# Patient Record
Sex: Female | Born: 1990 | Race: White | Hispanic: No | Smoking: Never smoker
Health system: Southern US, Community
[De-identification: ages and names within clinical notes are randomized; demographics above are authoritative.]

## PROBLEM LIST (undated history)

## (undated) HISTORY — PX: SHOULDER ARTHROSCOPY: SHX128

---

## 2018-04-15 ENCOUNTER — Other Ambulatory Visit: Payer: Self-pay

## 2018-04-15 ENCOUNTER — Emergency Department
Admission: EM | Admit: 2018-04-15 | Discharge: 2018-04-16 | Disposition: A | Discharge status: Home / Self Care | Payer: BLUE CROSS/BLUE SHIELD | Attending: Emergency Medicine | Admitting: Emergency Medicine

## 2018-04-15 ENCOUNTER — Encounter: Payer: Self-pay | Admitting: Emergency Medicine

## 2018-04-15 ENCOUNTER — Emergency Department: Payer: BLUE CROSS/BLUE SHIELD

## 2018-04-15 DIAGNOSIS — R1013 Epigastric pain: Secondary | ICD-10-CM | POA: Diagnosis not present

## 2018-04-15 DIAGNOSIS — R112 Nausea with vomiting, unspecified: Secondary | ICD-10-CM | POA: Diagnosis present

## 2018-04-15 DIAGNOSIS — K2900 Acute gastritis without bleeding: Secondary | ICD-10-CM | POA: Diagnosis not present

## 2018-04-15 DIAGNOSIS — F1210 Cannabis abuse, uncomplicated: Secondary | ICD-10-CM | POA: Diagnosis not present

## 2018-04-15 LAB — URINALYSIS, COMPLETE (UACMP) WITH MICROSCOPIC
BACTERIA UA: NONE SEEN
Bilirubin Urine: NEGATIVE
Glucose, UA: NEGATIVE mg/dL
KETONES UR: 5 mg/dL — AB
Leukocytes, UA: NEGATIVE
Nitrite: NEGATIVE
PROTEIN: NEGATIVE mg/dL
Specific Gravity, Urine: 1.019 (ref 1.005–1.030)
pH: 5 (ref 5.0–8.0)

## 2018-04-15 LAB — COMPREHENSIVE METABOLIC PANEL
ALBUMIN: 4.4 g/dL (ref 3.5–5.0)
ALT: 16 U/L (ref 0–44)
ANION GAP: 8 (ref 5–15)
AST: 21 U/L (ref 15–41)
Alkaline Phosphatase: 51 U/L (ref 38–126)
BUN: 15 mg/dL (ref 6–20)
CHLORIDE: 104 mmol/L (ref 98–111)
CO2: 28 mmol/L (ref 22–32)
Calcium: 9 mg/dL (ref 8.9–10.3)
Creatinine, Ser: 0.74 mg/dL (ref 0.44–1.00)
GFR calc non Af Amer: >60 mL/min (ref >60)
GLUCOSE: 101 mg/dL — AB (ref 70–99)
POTASSIUM: 4 mmol/L (ref 3.5–5.1)
Sodium: 140 mmol/L (ref 135–145)
Total Bilirubin: 0.5 mg/dL (ref 0.3–1.2)
Total Protein: 7.7 g/dL (ref 6.5–8.1)

## 2018-04-15 LAB — CBC
HEMATOCRIT: 38.1 % (ref 35.0–47.0)
HEMOGLOBIN: 13.2 g/dL (ref 12.0–16.0)
MCH: 30.7 pg (ref 26.0–34.0)
MCHC: 34.6 g/dL (ref 32.0–36.0)
MCV: 88.8 fL (ref 80.0–100.0)
Platelets: 233 10*3/uL (ref 150–440)
RBC: 4.29 MIL/uL (ref 3.80–5.20)
RDW: 12.8 % (ref 11.5–14.5)
WBC: 9.2 10*3/uL (ref 3.6–11.0)

## 2018-04-15 LAB — POCT PREGNANCY, URINE: PREG TEST UR: NEGATIVE

## 2018-04-15 LAB — LIPASE, BLOOD: LIPASE: 30 U/L (ref 11–51)

## 2018-04-15 NOTE — ED Triage Notes (Signed)
Pt arrives via POV with complaints of nausea since Tuesday; emesis intermittently since Wednesday; decreased appetite; and a dull pain above her navel that started Wednesday.

## 2018-04-15 NOTE — ED Notes (Addendum)
Pt states that she has been experiencing pain in the middle of abdominal area and nausea since Tuesday. It comes goes and when it happens the only thing she can do is lay down because it makes her feel SOB and weak. Pt has vomited once today. Pt is alert and oriented x 4. Family at bedside.

## 2018-04-15 NOTE — ED Provider Notes (Signed)
[facility] Emergency Department Provider Note   ____________________________________________   First MD Initiated Contact with Patient 04/15/18 2334     (approximate)  I have reviewed the triage vital signs and the nursing notes.   HISTORY  Chief Complaint Emesis and Abdominal Pain    HPI Molly Sweeney is a 27 y.o. female who comes into the hospital today with some nausea vomiting and abdominal pain.  The patient states that the symptoms started on Tuesday which was approximately 5 days ago.  The patient reports that she only vomited for 3 days but she has been feeling nauseous and having intermittent painful epigastric pain.  She is never had these symptoms before.  She has been taking ibuprofen but it has not been helping.  The patient denies any fevers or sick contacts.  Currently her pain is a 2 out of 10 in intensity.  She states that it comes in waves.  She feels okay when she is laying down but states that when she is moving around the pain gets worse.  The patient decided to come into the hospital today for evaluation.   History reviewed. No pertinent past medical history.  There are no active problems to display for this patient.   Past Surgical History:  Procedure Laterality Date  . SHOULDER ARTHROSCOPY      Prior to Admission medications   Medication Sig Start Date End Date Taking? Authorizing Provider  ondansetron (ZOFRAN ODT) 4 MG disintegrating tablet Take 1 tablet (4 mg total) by mouth every 8 (eight) hours as needed for nausea or vomiting. 04/16/18   Rebecka ApleyWebster, Wray Goehring P, MD  sucralfate (CARAFATE) 1 g tablet Take 1 tablet (1 g total) by mouth 2 (two) times daily. 04/16/18   Rebecka ApleyWebster, Liliana Brentlinger P, MD    Allergies Patient has no known allergies.  No family history on file.  Social History Social History   Tobacco Use  . Smoking status: Never Smoker  . Smokeless tobacco: Never Used  Substance Use Topics  . Alcohol use: Yes   Alcohol/week: 0.6 oz    Types: 1 Standard drinks or equivalent per week  . Drug use: Yes    Frequency: 1.0 times per week    Types: Marijuana    Review of Systems  Constitutional: No fever/chills Eyes: No visual changes. ENT: No sore throat. Cardiovascular: Denies chest pain. Respiratory: Denies shortness of breath. Gastrointestinal:  abdominal pain, nausea, vomiting.  No diarrhea.  No constipation. Genitourinary: Negative for dysuria. Musculoskeletal: Negative for back pain. Skin: Negative for rash. Neurological: Negative for headaches, focal weakness or numbness.   ____________________________________________   PHYSICAL EXAM:  VITAL SIGNS: ED Triage Vitals  Enc Vitals Group     BP 04/15/18 1725 118/62     Pulse Rate 04/15/18 1725 (!) 58     Resp 04/15/18 1725 18     Temp 04/15/18 1725 97.7 F (36.5 C)     Temp Source 04/15/18 1725 Oral     SpO2 04/15/18 1725 99 %     Weight 04/15/18 1727 190 lb (86.2 kg)     Height 04/15/18 1727 5\' 8"  (1.727 m)     Head Circumference --      Peak Flow --      Pain Score 04/15/18 1739 5     Pain Loc --      Pain Edu? --      Excl. in GC? --     Constitutional: Alert and oriented. Well appearing and in mild  distress. Eyes: Conjunctivae are normal. PERRL. EOMI. Head: Atraumatic. Nose: No congestion/rhinnorhea. Mouth/Throat: Mucous membranes are moist.  Oropharynx non-erythematous. Cardiovascular: Normal rate, regular rhythm. Grossly normal heart sounds.  Good peripheral circulation. Respiratory: Normal respiratory effort.  No retractions. Lungs CTAB. Gastrointestinal: Soft and nontender. No distention.  Positive bowel sounds Musculoskeletal: No lower extremity tenderness nor edema.   Neurologic:  Normal speech and language.  Skin:  Skin is warm, dry and intact.  Psychiatric: Mood and affect are normal.   ____________________________________________   LABS (all labs ordered are listed, but only abnormal results are  displayed)  Labs Reviewed  COMPREHENSIVE METABOLIC PANEL - Abnormal; Notable for the following components:      Result Value   Glucose, Bld 101 (*)    All other components within normal limits  URINALYSIS, COMPLETE (UACMP) WITH MICROSCOPIC - Abnormal; Notable for the following components:   Color, Urine YELLOW (*)    APPearance CLEAR (*)    Hgb urine dipstick SMALL (*)    Ketones, ur 5 (*)    All other components within normal limits  LIPASE, BLOOD  CBC  POC URINE PREG, ED  POCT PREGNANCY, URINE   ____________________________________________  EKG  ED ECG REPORT I, Rebecka Apley, the attending physician, personally viewed and interpreted this ECG.   Date: 04/14/2018  EKG Time: 1749  Rate: 60  Rhythm: normal sinus rhythm with sinus arrythmia  Axis: normal  Intervals:none  ST&T Change: none  ____________________________________________  RADIOLOGY  ED MD interpretation:  [place] abd RUQ: Unremarkable right upper quadrant ultrasound  Official radiology report(s): US Abdomen Limited Ruq  Result Date: 04/16/2018 CLINICAL DATA:  27 year old female with epigastric pain. EXAM: ULTRASOUND ABDOMEN LIMITED RIGHT UPPER QUADRANT COMPARISON:  None. FINDINGS: Gallbladder: No gallstones or wall thickening visualized. No sonographic Murphy sign noted by sonographer. Common bile duct: Diameter: 3 mm Liver: No focal lesion identified. Within normal limits in parenchymal echogenicity. Portal vein is patent on color Doppler imaging with normal direction of blood flow towards the liver. IMPRESSION: Unremarkable right upper quadrant ultrasound. Electronically Signed   By: Elgie Collard M.D.   On: 04/16/2018 00:24    ____________________________________________   PROCEDURES  Procedure(s) performed: None  Procedures  Critical Care performed: No  ____________________________________________   INITIAL IMPRESSION / ASSESSMENT AND PLAN / ED COURSE  As part of my medical decision  making, I reviewed the following data within the electronic MEDICAL RECORD NUMBER Notes from prior ED visits and [place] Controlled Substance Database   This is a 27 year old female who comes into the hospital today with some nausea vomiting and upper abdominal pain.  The patient has been having the symptoms for the past 5 days but she was concerned that she was still having some pain so she wanted to come to get evaluated.  We did check some blood work to include a CBC, CMP, lipase and a urinalysis.  The patient's blood work is unremarkable.  She does not have any significant pain on exam but given this intermittent pain I will send her for an ultrasound looking for possible gallstones.  The patient will be reassessed.  The patient has no pain at this time.  She will be discharged home and encouraged to follow-up with the acute care clinic.  She should return with any worsening symptoms or any other concerns.        ____________________________________________   FINAL CLINICAL IMPRESSION(S) / ED DIAGNOSES  Final diagnoses:  Epigastric pain  Acute gastritis without hemorrhage, unspecified  gastritis type     ED Discharge Orders        Ordered    ondansetron (ZOFRAN ODT) 4 MG disintegrating tablet  Every 8 hours PRN     04/16/18 0139    sucralfate (CARAFATE) 1 g tablet  2 times daily     04/16/18 0139       Note:  This document was prepared using Dragon voice recognition software and may include unintentional dictation errors.    Rebecka Apley, MD 04/16/18 [phone]

## 2018-04-16 MED ORDER — ONDANSETRON 4 MG PO TBDP: 4.0000 mg | ORAL_TABLET | Freq: Three times a day (TID) | ORAL | 0 refills | Status: AC | PRN

## 2018-04-16 MED ORDER — SUCRALFATE 1 G PO TABS: 1.0000 g | ORAL_TABLET | Freq: Two times a day (BID) | ORAL | 0 refills | Status: AC

## 2018-04-16 NOTE — Discharge Instructions (Addendum)
Please follow up with your primary care physician for further evaluation of your symptoms. Please return with an worsening condition.

## 2018-04-16 NOTE — ED Notes (Addendum)
Gave pt some water for a PO challenge. Will check on pt soon to see how she tolerated it.

## 2019-06-24 ENCOUNTER — Other Ambulatory Visit: Payer: Self-pay

## 2019-06-24 DIAGNOSIS — Z20822 Contact with and (suspected) exposure to covid-19: Secondary | ICD-10-CM

## 2019-06-26 LAB — NOVEL CORONAVIRUS, NAA: SARS-CoV-2, NAA: DETECTED — AB

## 2019-12-20 IMAGING — US US ABDOMEN LIMITED
1 series · 14 of 25 positions shown · non-contrast
Comparison: None.

CLINICAL DATA: 27-year-old female with epigastric pain.

EXAM:
ULTRASOUND ABDOMEN LIMITED RIGHT UPPER QUADRANT

[Series 1: us abdomen limited · 14 of 52 slices shown]
[im 1/52]
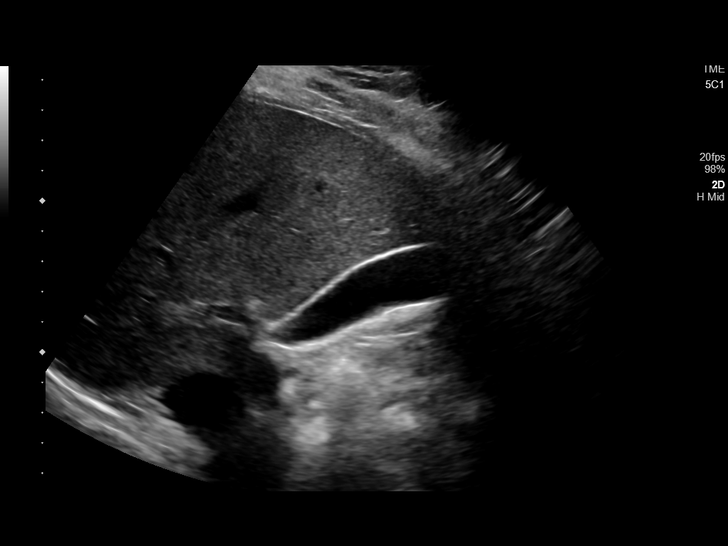
[im 5/52]
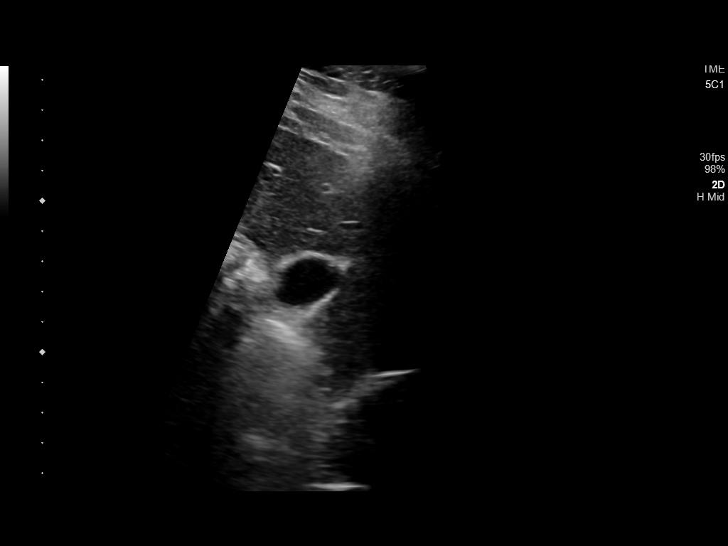
[im 9/52]
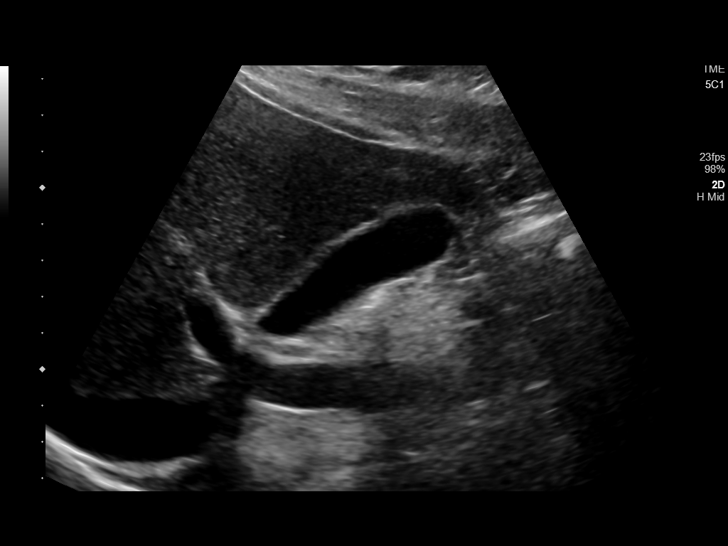
[im 13/52]
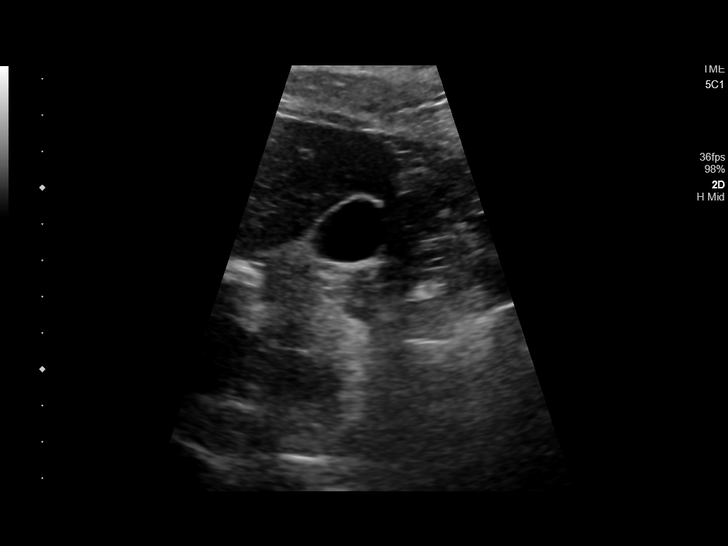
[im 18/52]
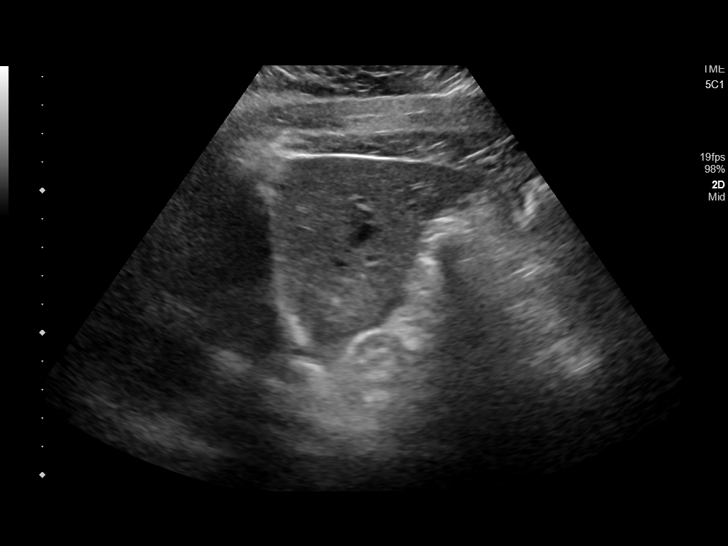
[im 20/52]
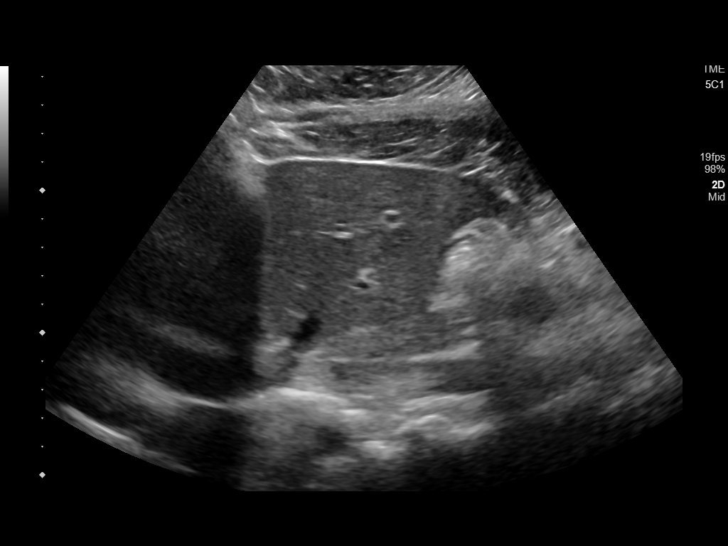
[im 24/52]
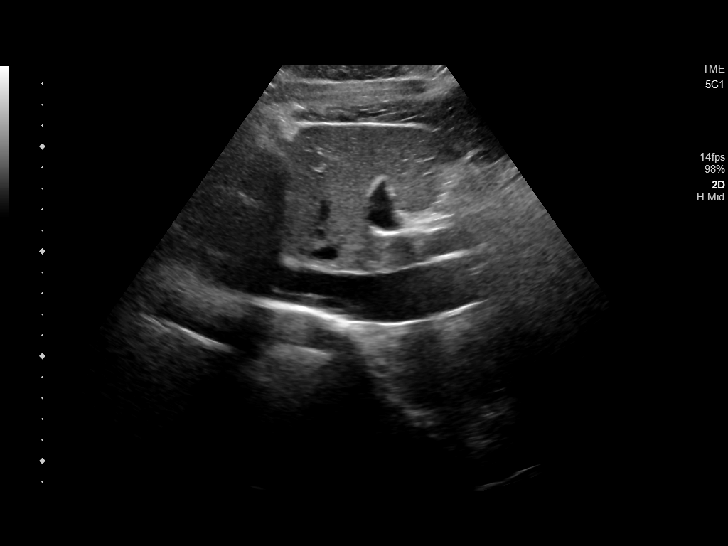
[im 28/52]
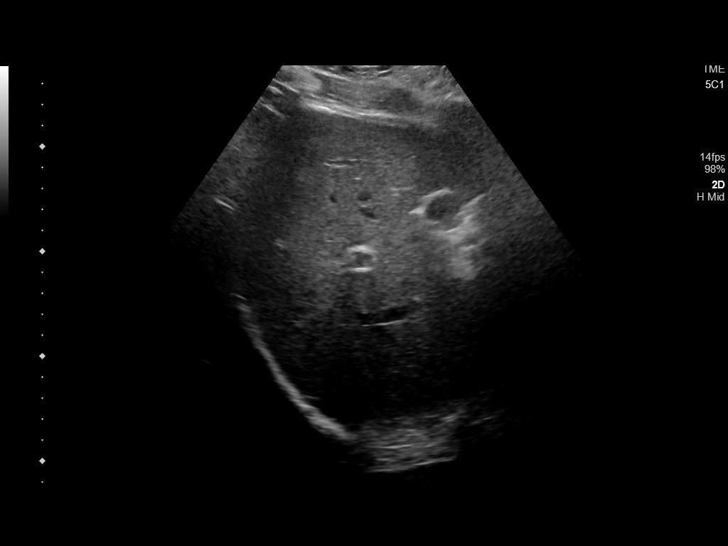
[im 32/52]
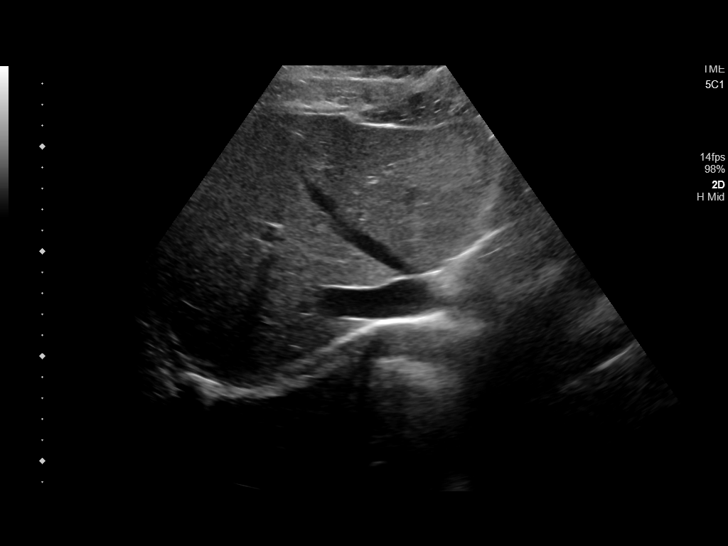
[im 35/52]
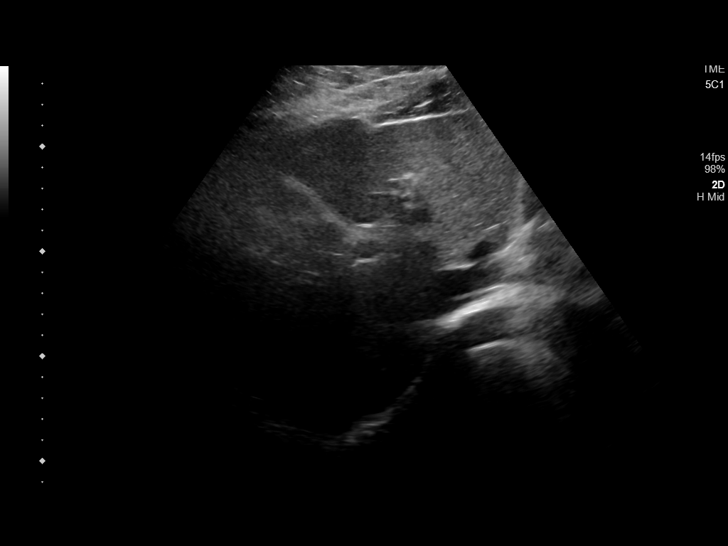
[im 39/52]
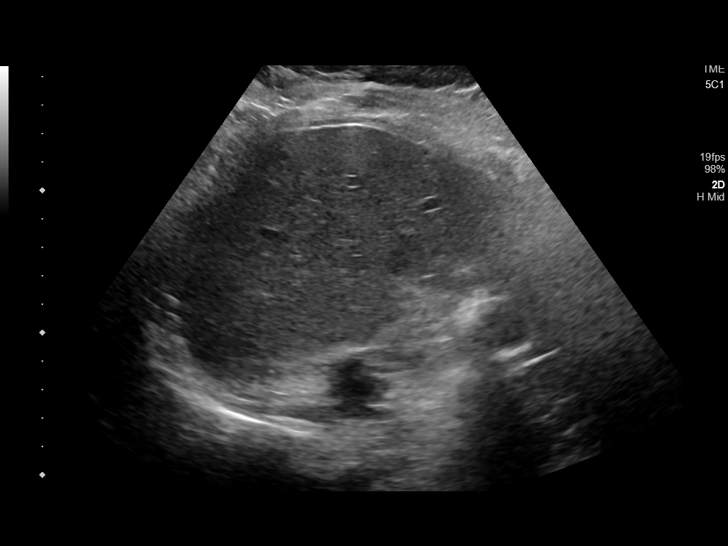
[im 43/52]
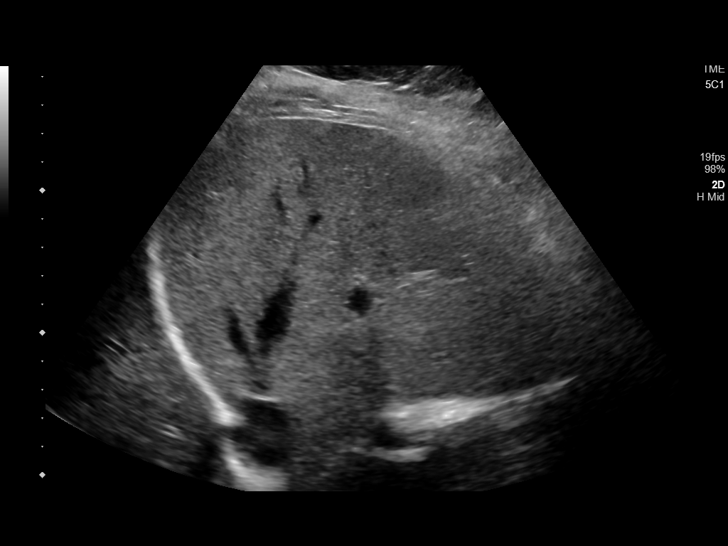
[im 47/52]
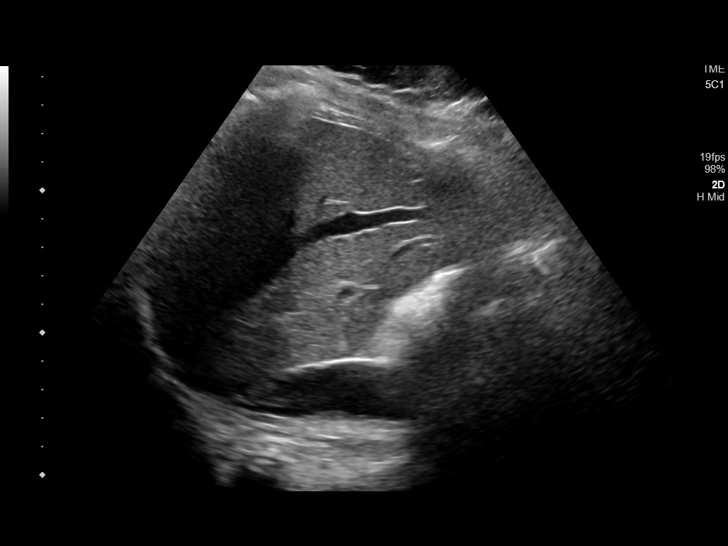
[im 52/52]
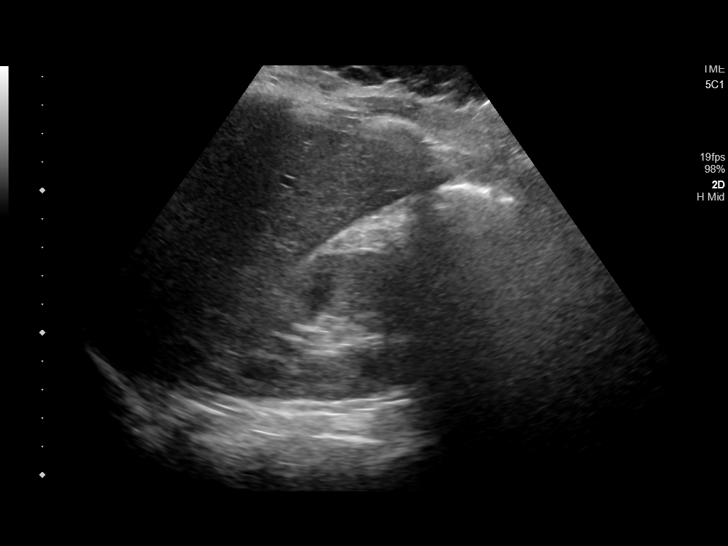

[14 of 25 positions shown; findings below may reference images not displayed]

FINDINGS: Gallbladder:

No gallstones or wall thickening visualized. No sonographic Murphy
sign noted by sonographer.

Common bile duct:

Diameter: 3 mm

Liver:

No focal lesion identified. Within normal limits in parenchymal
echogenicity. Portal vein is patent on color Doppler imaging with
normal direction of blood flow towards the liver.
IMPRESSION: Unremarkable right upper quadrant ultrasound.

## 2023-03-21 ENCOUNTER — Other Ambulatory Visit: Payer: Self-pay

## 2023-03-21 ENCOUNTER — Emergency Department
Admission: EM | Admit: 2023-03-21 | Discharge: 2023-03-21 | Disposition: A | Discharge status: Home / Self Care | Payer: No Typology Code available for payment source | Attending: Emergency Medicine | Admitting: Emergency Medicine

## 2023-03-21 ENCOUNTER — Encounter: Payer: Self-pay | Admitting: Emergency Medicine

## 2023-03-21 DIAGNOSIS — R0789 Other chest pain: Secondary | ICD-10-CM | POA: Diagnosis not present

## 2023-03-21 DIAGNOSIS — R079 Chest pain, unspecified: Secondary | ICD-10-CM | POA: Diagnosis present

## 2023-03-21 LAB — BASIC METABOLIC PANEL
Anion gap: 9 (ref 5–15)
BUN: 11 mg/dL (ref 6–20)
CO2: 24 mmol/L (ref 22–32)
Calcium: 9.1 mg/dL (ref 8.9–10.3)
Chloride: 105 mmol/L (ref 98–111)
Creatinine, Ser: 0.64 mg/dL (ref 0.44–1.00)
GFR, Estimated: >60 mL/min (ref >60)
Glucose, Bld: 95 mg/dL (ref 70–99)
Potassium: 4.4 mmol/L (ref 3.5–5.1)
Sodium: 138 mmol/L (ref 135–145)

## 2023-03-21 LAB — CBC
HCT: 39 % (ref 36.0–46.0)
Hemoglobin: 12.5 g/dL (ref 12.0–15.0)
MCH: 29.4 pg (ref 26.0–34.0)
MCHC: 32.1 g/dL (ref 30.0–36.0)
MCV: 91.8 fL (ref 80.0–100.0)
Platelets: 206 10*3/uL (ref 150–400)
RBC: 4.25 MIL/uL (ref 3.87–5.11)
RDW: 12.4 % (ref 11.5–15.5)
WBC: 8.3 10*3/uL (ref 4.0–10.5)
nRBC: 0 % (ref 0.0–0.2)

## 2023-03-21 LAB — TROPONIN I (HIGH SENSITIVITY): Troponin I (High Sensitivity): <2 ng/L (ref <18)

## 2023-03-21 NOTE — Discharge Instructions (Signed)
Your blood work is normal.  You did not wish to undergo another x-ray today given that you had a normal line at the urgent care, though I am unable to view this x-ray.  You also did not wish to do any additional blood work.  Please return for any new, worsening, or change in symptoms or other concerns.  It was a pleasure caring for you today.

## 2023-03-21 NOTE — ED Provider Notes (Signed)
[facility] Provider Note    Event Date/Time   First MD Initiated Contact with Patient 03/21/23 1026     (approximate)   History   Chest Pain   HPI  Molly Sweeney is a 32 y.o. female who presents today for evaluation of chest pain that has been present for the past 1 week.  Patient denies pain with inspiration.  She denies feeling short of breath.  She reports that she recently had a viral URI.  She denies fevers or chills.  She denies calf pain or leg swelling.  She denies personal or family history of PE or DVT.  She was able to play pickle ball with her nephews without any discomfort.  She went to urgent care and they told her that she had a normal chest x-ray.  Patient has not had any cardiac or pulmonary problems in the past.  She does not smoke.  She does not take any medications including exogenous hormones.  No recent trips or travel.  No recent periods of immobilization.  There are no problems to display for this patient.         Physical Exam   Triage Vital Signs: ED Triage Vitals  Enc Vitals Group     BP 03/21/23 1000 101/64     Pulse Rate 03/21/23 1000 86     Resp 03/21/23 1000 16     Temp 03/21/23 1000 97.8 F (36.6 C)     Temp Source 03/21/23 1000 Oral     SpO2 03/21/23 1000 96 %     Weight 03/21/23 0957 190 lb 0.6 oz (86.2 kg)     Height 03/21/23 0957 5\' 8"  (1.727 m)     Head Circumference --      Peak Flow --      Pain Score 03/21/23 0957 6     Pain Loc --      Pain Edu? --      Excl. in GC? --     Most recent vital signs: Vitals:   03/21/23 1000  BP: 101/64  Pulse: 86  Resp: 16  Temp: 97.8 F (36.6 C)  SpO2: 96%    Physical Exam Vitals and nursing note reviewed.  Constitutional:      General: Awake and alert. No acute distress.    Appearance: Normal appearance. The patient is normal weight.  HENT:     Head: Normocephalic and atraumatic.     Mouth: Mucous membranes are moist.  Eyes:     General: PERRL.  Normal EOMs        Right eye: No discharge.        Left eye: No discharge.     Conjunctiva/sclera: Conjunctivae normal.  Cardiovascular:     Rate and Rhythm: Normal rate and regular rhythm.     Pulses: Normal pulses.     Heart sounds: Normal heart sounds.  No friction rub Mild tenderness to palpation to anterior and lateral right rib cage Pulmonary:     Effort: Pulmonary effort is normal. No respiratory distress.     Breath sounds: Normal breath sounds.  Abdominal:     Abdomen is soft. There is no abdominal tenderness. No rebound or guarding. No distention. Musculoskeletal:        General: No swelling. Normal range of motion.     Cervical back: Normal range of motion and neck supple.  Skin:    General: Skin is warm and dry.     Capillary Refill: Capillary refill takes  less than 2 seconds.     Findings: No rash.  Neurological:     Mental Status: The patient is awake and alert.      ED Results / Procedures / Treatments   Labs (all labs ordered are listed, but only abnormal results are displayed) Labs Reviewed  BASIC METABOLIC PANEL  CBC  POC URINE PREG, ED  TROPONIN I (HIGH SENSITIVITY)  TROPONIN I (HIGH SENSITIVITY)     EKG     RADIOLOGY Patient declined    PROCEDURES:  Critical Care performed:   Procedures   MEDICATIONS ORDERED IN ED: Medications - No data to display   IMPRESSION / MDM / ASSESSMENT AND PLAN / ED COURSE  I reviewed the triage vital signs and the nursing notes.   Differential diagnosis includes, but is not limited to, pleurisy, costochondritis, pulmonary embolism, acute coronary syndrome, pericarditis, myocarditis, pneumothorax.  Patient is awake and alert, hemodynamically stable and afebrile.  She has no tachycardia or hypoxia.  I reviewed the patient's chart.  I am unable to see her visit to the urgent care.  EKG obtained in triage and reviewed by me as well as attending MD.  It looks very similar to the most recent EKG she has in  the computer which was in 2019.  Labs are overall reassuring.  Her troponin is undetectable.  No indication for repeat troponin given the duration of her symptoms.  We discussed the option of obtaining D-dimer, the patient declined.  She well score 0.  She also declined repeat chest x-ray.  She was advised that I might be missing something without getting a chest x-ray as I am unable to view her x-ray, though she still declines.  She declined analgesia.  I do not suspect myocarditis given the lack of an elevated troponin, lack of fever.  No friction rub, diffuse ST elevation to suggest pericarditis.  Given that this is reproducible, she may have costochondritis.  We discussed strict return precautions and the importance of close outpatient follow-up.  Patient was discharged in stable condition.   Patient's presentation is most consistent with acute complicated illness / injury requiring diagnostic workup.     FINAL CLINICAL IMPRESSION(S) / ED DIAGNOSES   Final diagnoses:  Atypical chest pain     Rx / DC Orders   ED Discharge Orders     None        Note:  This document was prepared using Dragon voice recognition software and may include unintentional dictation errors.   Keturah Shavers 03/21/23 1137    Chesley Noon, MD 03/21/23 [phone]

## 2023-03-21 NOTE — ED Notes (Signed)
Pt had chest x ray at [facility] today.

## 2023-03-21 NOTE — ED Triage Notes (Signed)
Pt here with right sided cp that started Sun night. Pt states the pain radiates to her right arm and has been pretty constant. Pt states she also just had an abnormal EKG at [facility] today and was told to come to the ED. Pt denies NVD.
